# Patient Record
Sex: Male | Born: 1935 | Race: White | Hispanic: No | Marital: Married | Smoking: Former smoker
Health system: Southern US, Community
[De-identification: ages and names within clinical notes are randomized; demographics above are authoritative.]

## PROBLEM LIST (undated history)

## (undated) DIAGNOSIS — E785 Hyperlipidemia, unspecified: Secondary | ICD-10-CM

## (undated) DIAGNOSIS — F0280 Dementia in other diseases classified elsewhere without behavioral disturbance: Secondary | ICD-10-CM

## (undated) DIAGNOSIS — G309 Alzheimer's disease, unspecified: Secondary | ICD-10-CM

## (undated) DIAGNOSIS — N400 Enlarged prostate without lower urinary tract symptoms: Secondary | ICD-10-CM

## (undated) DIAGNOSIS — I4891 Unspecified atrial fibrillation: Secondary | ICD-10-CM

## (undated) HISTORY — PX: PACEMAKER IMPLANT: EP1218

---

## 2017-10-23 ENCOUNTER — Encounter (HOSPITAL_COMMUNITY): Payer: Self-pay

## 2017-10-23 ENCOUNTER — Emergency Department (HOSPITAL_COMMUNITY): Payer: Medicare HMO

## 2017-10-23 ENCOUNTER — Emergency Department (HOSPITAL_COMMUNITY)
Admission: EM | Admit: 2017-10-23 | Discharge: 2017-10-24 | Disposition: A | Discharge status: Other Acute Inpatient Hospital | Payer: Medicare HMO | Attending: Emergency Medicine | Admitting: Emergency Medicine

## 2017-10-23 DIAGNOSIS — G301 Alzheimer's disease with late onset: Secondary | ICD-10-CM | POA: Insufficient documentation

## 2017-10-23 DIAGNOSIS — G3184 Mild cognitive impairment, so stated: Secondary | ICD-10-CM | POA: Insufficient documentation

## 2017-10-23 DIAGNOSIS — F02818 Dementia in other diseases classified elsewhere, unspecified severity, with other behavioral disturbance: Secondary | ICD-10-CM

## 2017-10-23 DIAGNOSIS — Z79899 Other long term (current) drug therapy: Secondary | ICD-10-CM | POA: Insufficient documentation

## 2017-10-23 DIAGNOSIS — R451 Restlessness and agitation: Secondary | ICD-10-CM | POA: Insufficient documentation

## 2017-10-23 DIAGNOSIS — R443 Hallucinations, unspecified: Secondary | ICD-10-CM | POA: Diagnosis not present

## 2017-10-23 DIAGNOSIS — F0281 Dementia in other diseases classified elsewhere with behavioral disturbance: Secondary | ICD-10-CM

## 2017-10-23 DIAGNOSIS — Z008 Encounter for other general examination: Secondary | ICD-10-CM

## 2017-10-23 HISTORY — DX: Hyperlipidemia, unspecified: E78.5

## 2017-10-23 HISTORY — DX: Unspecified atrial fibrillation: I48.91

## 2017-10-23 HISTORY — DX: Alzheimer's disease, unspecified: G30.9

## 2017-10-23 HISTORY — DX: Dementia in other diseases classified elsewhere, unspecified severity, without behavioral disturbance, psychotic disturbance, mood disturbance, and anxiety: F02.80

## 2017-10-23 HISTORY — DX: Benign prostatic hyperplasia without lower urinary tract symptoms: N40.0

## 2017-10-23 LAB — URINALYSIS, ROUTINE W REFLEX MICROSCOPIC
BACTERIA UA: NONE SEEN
GLUCOSE, UA: NEGATIVE mg/dL
KETONES UR: 5 mg/dL — AB
NITRITE: NEGATIVE
PROTEIN: 100 mg/dL — AB
Specific Gravity, Urine: 1.031 — ABNORMAL HIGH (ref 1.005–1.030)
pH: 5 (ref 5.0–8.0)

## 2017-10-23 LAB — CBC WITH DIFFERENTIAL/PLATELET
BASOS PCT: 0 %
Basophils Absolute: 0 10*3/uL (ref 0.0–0.1)
EOS ABS: 0 10*3/uL (ref 0.0–0.7)
Eosinophils Relative: 1 %
HCT: 36.1 % — ABNORMAL LOW (ref 39.0–52.0)
Hemoglobin: 11.5 g/dL — ABNORMAL LOW (ref 13.0–17.0)
Lymphocytes Relative: 24 %
Lymphs Abs: 1.2 10*3/uL (ref 0.7–4.0)
MCH: 30 pg (ref 26.0–34.0)
MCHC: 31.9 g/dL (ref 30.0–36.0)
MCV: 94.3 fL (ref 78.0–100.0)
MONO ABS: 0.5 10*3/uL (ref 0.1–1.0)
MONOS PCT: 11 %
NEUTROS PCT: 64 %
Neutro Abs: 3.3 10*3/uL (ref 1.7–7.7)
PLATELETS: 194 10*3/uL (ref 150–400)
RBC: 3.83 MIL/uL — ABNORMAL LOW (ref 4.22–5.81)
RDW: 14.9 % (ref 11.5–15.5)
WBC: 5 10*3/uL (ref 4.0–10.5)

## 2017-10-23 LAB — COMPREHENSIVE METABOLIC PANEL
ALK PHOS: 66 U/L (ref 38–126)
ALT: 23 U/L (ref 17–63)
AST: 34 U/L (ref 15–41)
Albumin: 3.5 g/dL (ref 3.5–5.0)
Anion gap: 11 (ref 5–15)
BUN: 32 mg/dL — ABNORMAL HIGH (ref 6–20)
CALCIUM: 9.3 mg/dL (ref 8.9–10.3)
CO2: 26 mmol/L (ref 22–32)
CREATININE: 1.48 mg/dL — AB (ref 0.61–1.24)
Chloride: 105 mmol/L (ref 101–111)
GFR calc non Af Amer: 42 mL/min — ABNORMAL LOW (ref >60)
GFR, EST AFRICAN AMERICAN: 48 mL/min — AB (ref >60)
GLUCOSE: 124 mg/dL — AB (ref 65–99)
Potassium: 4 mmol/L (ref 3.5–5.1)
SODIUM: 142 mmol/L (ref 135–145)
TOTAL PROTEIN: 6.6 g/dL (ref 6.5–8.1)
Total Bilirubin: 0.8 mg/dL (ref 0.3–1.2)

## 2017-10-23 LAB — RAPID URINE DRUG SCREEN, HOSP PERFORMED
Amphetamines: NOT DETECTED
Barbiturates: NOT DETECTED
Benzodiazepines: POSITIVE — AB
Cocaine: NOT DETECTED
OPIATES: NOT DETECTED
Tetrahydrocannabinol: NOT DETECTED

## 2017-10-23 LAB — ETHANOL

## 2017-10-23 MED ORDER — ZIPRASIDONE MESYLATE 20 MG IM SOLR
10.0000 mg | Freq: Once | INTRAMUSCULAR | Status: AC
  Administered 2017-10-23: 10 mg via INTRAMUSCULAR
  Filled 2017-10-23: qty 20

## 2017-10-23 MED ORDER — ZIPRASIDONE MESYLATE 20 MG IM SOLR: 10.0000 mg | Freq: Two times a day (BID) | INTRAMUSCULAR | Status: DC | PRN

## 2017-10-23 MED ORDER — LORAZEPAM 1 MG PO TABS: 1.0000 mg | ORAL_TABLET | Freq: Four times a day (QID) | ORAL | Status: DC | PRN

## 2017-10-23 MED ORDER — STERILE WATER FOR INJECTION IJ SOLN
INTRAMUSCULAR | Status: AC
  Administered 2017-10-23: 10 mL
  Filled 2017-10-23: qty 10

## 2017-10-23 MED ORDER — ZOLPIDEM TARTRATE 5 MG PO TABS
5.0000 mg | ORAL_TABLET | Freq: Every evening | ORAL | Status: DC | PRN
  Administered 2017-10-23: 5 mg via ORAL
  Filled 2017-10-23: qty 1

## 2017-10-23 MED ORDER — LORAZEPAM 2 MG/ML IJ SOLN
1.0000 mg | Freq: Once | INTRAMUSCULAR | Status: AC
  Administered 2017-10-23: 1 mg via INTRAMUSCULAR
  Filled 2017-10-23: qty 1

## 2017-10-23 MED ORDER — OLANZAPINE 5 MG PO TBDP
5.0000 mg | ORAL_TABLET | Freq: Three times a day (TID) | ORAL | Status: DC | PRN
  Administered 2017-10-23: 5 mg via ORAL
  Filled 2017-10-23: qty 1

## 2017-10-23 NOTE — ED Provider Notes (Signed)
[facility] EMERGENCY DEPARTMENT Provider Note   CSN: [id number] Arrival date & time: 10/23/17  1556     History   Chief Complaint Chief Complaint  Patient presents with  . Agitation  . Hallucinations    HPI Aaron Preston is a 84 y.o. male.  Patient presents by EMS for evaluation of agitated behavior.  Currently resides in a nursing care facility, who reported to EMS that they could not handle him there with the behavior he is exhibiting.  Documentation with the patient indicate that he has been "yelling, screaming, hallucinating, day and night.  Staff members at the facility indicated to EMS that they wanted him transferred to a geriatric psychiatric hospital.  Patient is unable to contribute to history.  Level 5 caveat-dementia  HPI  Past Medical History:  Diagnosis Date  . A-fib (HCC)   . Alzheimer's dementia   . BPH (benign prostatic hyperplasia)   . Hyperlipidemia     There are no active problems to display for this patient.   Past Surgical History:  Procedure Laterality Date  . PACEMAKER IMPLANT         Home Medications    Prior to Admission medications   Medication Sig Start Date End Date Taking? Authorizing Provider  aspirin 81 MG chewable tablet Chew by mouth daily.   Yes [provider]  atorvastatin (LIPITOR) 40 MG tablet Take 40 mg by mouth daily.   Yes [provider]  bisacodyl (DULCOLAX) 5 MG EC tablet Take 5 mg by mouth once.   Yes [provider]  divalproex (DEPAKOTE SPRINKLE) 125 MG capsule Take 250 mg by mouth 3 (three) times daily. At 0800, 1200, 1700-do not crush   Yes [provider]  donepezil (ARICEPT) 10 MG tablet Take 10 mg by mouth at bedtime.   Yes [provider]  FLUoxetine (PROZAC) 10 MG capsule Take 10 mg by mouth 2 (two) times daily.   Yes [provider]  Lysine 1000 MG TABS Take 0.5 tablets by mouth 1 day or 1 dose.   Yes [provider]  memantine (NAMENDA) 10 MG  tablet Take 10 mg by mouth 2 (two) times daily.   Yes [provider]  Misc Natural Products (OSTEO BI-FLEX/5-LOXIN ADVANCED PO) Take 1 tablet by mouth 1 day or 1 dose.   Yes [provider]  Multiple Vitamins-Minerals (ICAPS) TABS Take 1 tablet by mouth 1 day or 1 dose.   Yes [provider]  Omega-3 Fatty Acids (FISH OIL) 1000 MG CAPS Take 2,000 mg by mouth 1 day or 1 dose.   Yes [provider]  tamsulosin (FLOMAX) 0.4 MG CAPS capsule Take 0.4 mg by mouth daily.   Yes [provider]  traZODone (DESYREL) 50 MG tablet Take 50 mg by mouth at bedtime.   Yes [provider]  vitamin B-12 (CYANOCOBALAMIN) 1000 MCG tablet Take 1,000 mcg by mouth daily.   Yes [provider]  senna-docusate (SENOKOT-S) 8.6-50 MG tablet Take 2 tablets by mouth 2 (two) times daily. PRN for constipation    [provider]    Family History No family history on file.  Social History Social History   Tobacco Use  . Smoking status: Former Smoker    Types: Pipe  Substance Use Topics  . Alcohol use: Yes    Comment: social  . Drug use: No     Allergies   Patient has no known allergies.   Review of Systems Review of Systems  Unable to perform ROS: Dementia     Physical Exam Updated Vital Signs BP (!) 169/112   Pulse 79   Temp 97.8 F (36.6 C) (Axillary)   Resp 17   SpO2 100%   Physical Exam  Constitutional: He appears well-developed. He appears distressed (Agitated, requiring restraints to stay in bed).  Elderly, cursing, spitting, yelling, and not redirectable.  A police officer is holding his arms to prevent him from harming himself or staff.  HENT:  Head: Normocephalic and atraumatic.  Right Ear: External ear normal.  Left Ear: External ear normal.  Eyes: Conjunctivae and EOM are normal. Pupils are equal, round, and reactive to light.  Neck: Normal range of motion and phonation normal. Neck supple.  Cardiovascular:  Normal rate, regular rhythm and normal heart sounds.  Pulmonary/Chest: Effort normal and breath sounds normal. He exhibits no bony tenderness.  Musculoskeletal: Normal range of motion.  Neurological: He is alert. No cranial nerve deficit. He exhibits normal muscle tone. Coordination normal.  No dysarthria.  Uncooperative with suggestions to calm down and answer questions.  Skin: Skin is warm, dry and intact.  Psychiatric:  Agitated  Nursing note and vitals reviewed.    ED Treatments / Results  Labs (all labs ordered are listed, but only abnormal results are displayed) Labs Reviewed  COMPREHENSIVE METABOLIC PANEL - Abnormal; Notable for the following components:      Result Value   Glucose, Bld 124 (*)    BUN 32 (*)    Creatinine, Ser 1.48 (*)    GFR calc non Af Amer 42 (*)    GFR calc Af Amer 48 (*)    All other components within normal limits  CBC WITH DIFFERENTIAL/PLATELET - Abnormal; Notable for the following components:   RBC 3.83 (*)    Hemoglobin 11.5 (*)    HCT 36.1 (*)    All other components within normal limits  RAPID URINE DRUG SCREEN, HOSP PERFORMED - Abnormal; Notable for the following components:   Benzodiazepines POSITIVE (*)    All other components within normal limits  URINALYSIS, ROUTINE W REFLEX MICROSCOPIC - Abnormal; Notable for the following components:   Color, Urine AMBER (*)    APPearance HAZY (*)    Specific Gravity, Urine 1.031 (*)    Hgb urine dipstick MODERATE (*)    Bilirubin Urine SMALL (*)    Ketones, ur 5 (*)    Protein, ur 100 (*)    Leukocytes, UA TRACE (*)    Squamous Epithelial / LPF 0-5 (*)    All other components within normal limits  ETHANOL    EKG  EKG Interpretation None       Radiology Ct Head Wo Contrast  Result Date: 10/23/2017 CLINICAL DATA:  Altered mental status EXAM: CT HEAD WITHOUT CONTRAST TECHNIQUE: Contiguous axial images were obtained from the base of the skull through the vertex without intravenous contrast.  COMPARISON:  None. FINDINGS: Brain: No mass lesion, intraparenchymal hemorrhage or extra-axial collection. No evidence of acute cortical infarct. There is periventricular hypoattenuation compatible with chronic microvascular disease. 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 Generalized volume loss with ex vacuo dilatation of the ventricles. Vascular: No hyperdense vessel or unexpected calcification. Skull: Normal visualized skull base, calvarium and extracranial soft tissues. Sinuses/Orbits: No sinus fluid levels or advanced mucosal thickening. No mastoid effusion. Normal orbits. 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 Electronically Signed   By: Chrisandra NettersKevin  Herman M.D.  On: 10/23/2017 19:37    Procedures Procedures (including critical care time)  Medications Ordered in ED Medications  zolpidem (AMBIEN) tablet 5 mg (5 mg Oral Given 10/23/17 2236)  OLANZapine zydis (ZYPREXA) disintegrating tablet 5 mg (5 mg Oral Given 10/23/17 2236)    And  LORazepam (ATIVAN) tablet 1 mg (not administered)    And  ziprasidone (GEODON) injection 10 mg (not administered)  ziprasidone (GEODON) injection 10 mg (10 mg Intramuscular Given 10/23/17 1625)  LORazepam (ATIVAN) injection 1 mg (1 mg Intramuscular Given 10/23/17 1954)  sterile water (preservative free) injection (10 mLs  Given 10/23/17 1626)     Initial Impression / Assessment and Plan / ED Course  I have reviewed the triage vital signs and the nursing notes.  Pertinent labs & imaging results that were available during my care of the patient were reviewed by me and considered in my medical decision making (see chart for details).  Clinical Course as of Oct 24 42  Thu Oct 23, 2017  1806 Abnormal elevation, catheter sample.  Doubt urinary tract infection RBC / HPF: TOO NUMEROUS TO COUNT [EW]  1807 Mild elevation Creatinine: (!) 1.48 [EW]  1807 Normal Sodium:  142 [EW]  1807 normal WBC: 5.0 [EW]  1807 normal Alcohol, Ethyl (B): <10 [EW]    Clinical Course User Index [EW] Mancel Bale, MD     Patient Vitals for the past 24 hrs:  BP Temp Temp src Pulse Resp SpO2  10/24/17 0030 (!) 169/112 - - - - -  10/23/17 2230 (!) 142/96 - - 79 - 100 %  10/23/17 2200 120/73 - - 69 - 97 %  10/23/17 2130 (!) 147/73 - - 76 - 99 %  10/23/17 2100 129/81 - - 65 - 96 %  10/23/17 2000 (!) 114/55 - - 66 - 95 %  10/23/17 1957 (!) 129/57 97.8 F (36.6 C) Axillary 69 17 95 %  10/23/17 1816 (!) 123/59 - - 70 16 96 %  10/23/17 1815 (!) 123/59 - - - - -  10/23/17 1609 109/64 - - - - -  10/23/17 1604 - (!) 97 F (36.1 C) - 70 20 98 %    After screening labs and physical examination, the patient is medically cleared for treatment by psychiatry.   Final Clinical Impressions(s) / ED Diagnoses   Final diagnoses:  Agitation  Late onset Alzheimer's disease with behavioral disturbance   Confusion, with behavioral disturbance and Alzheimer's dementia.  Doubt serious bacterial infection, urinary tract infection, CVA, metabolic instability.  He requires evaluation and treatment by psychiatry.  He will likely need to be admitted to a geriatric psychiatric facility.  Nursing Notes Reviewed/ Care Coordinated Applicable Imaging Reviewed Interpretation of Laboratory Data incorporated into ED treatment   Plan-as per TTS in conjunction with oncoming provider team  ED Discharge Orders    None       Mancel Bale, MD 10/24/17 [phone]

## 2017-10-23 NOTE — ED Notes (Signed)
Family at bedside at this time

## 2017-10-23 NOTE — ED Notes (Signed)
tts in progress at this time 

## 2017-10-23 NOTE — BH Assessment (Signed)
[facility] Assessment Progress Note  Per Nira ConnJason Berry, NP pt is recommended for gero-psych treatment. TTS to seek placement. EDP Dr. Effie ShyWentz, MD has been advised of the disposition.  Princess BruinsAquicha Lawerence Dery, MSW, LCSW Therapeutic Triage Specialist  [phone]

## 2017-10-23 NOTE — ED Triage Notes (Addendum)
[facility] due to aggressive and combative behavior. Behavior has been present for 2 weeks or more  and has increased. Pt has alzheimers and seen by psychiatrist. Pt has not been sleeping at night due to agitation

## 2017-10-23 NOTE — Progress Notes (Signed)
TTS attempted to notify the pt's nurse of the disposition but did not get an answer after multiple attempts.  Princess BruinsAquicha Ketan Renz, MSW, LCSW Therapeutic Triage Specialist  [phone]

## 2017-10-23 NOTE — ED Notes (Signed)
Pt yelling out at this time, unable to make out what pt is saying, sitter reports pt has hit side rails on bed a few times

## 2017-10-23 NOTE — ED Notes (Signed)
Patient transported to CT 

## 2017-10-23 NOTE — ED Notes (Signed)
Pt brought in by EMS. Pt agitated and screaming at EMS and staff. Unable to make sense of what pt is saying, Pt attempting to spit on staff. Wife and family at bedside. Family reports he has been at assisted living for 2 weeks and have declined mentally and physically for the past weeks. Staff unable to handle pt per family

## 2017-10-23 NOTE — BH Assessment (Addendum)
Tele Assessment Note   Patient Name: Aaron Preston MRN: [id number] Referring Physician: Mancel Bale, MD Location of Patient: [facility] Location of Provider: Behavioral Health TTS Department  Mort Smelser is an 84 y.o. male who presents to the ED voluntarily accompanied by his wife and his caregiver. Pt does not respond to direct questions during the assessment and much of the information is gathered from collateral sources. Pt appears to be restless in the bed and constantly fidgeting with his clothing and the blanket. Pt's caregiver reports the pt was recently relocated to an assisted living facility due to being too aggressive to manage at home. The caregiver states since the pt has gone to the assisted living facility 2 weeks ago, he has declined significantly. Pt's caregiver reports the pt was walking on his own prior to going to the facility but he is now in a wheelchair. The pt reportedly refuses to eat, does not groom, punches holes in the walls, kicks at individuals that try to assist him, does not speak, and urinates and defecates on himself. Pt's caregiver states this all just started after he moved into the assisted living facility 2 weeks ago. Caregiver states the pt has been experiencing AVH. Caregiver states the AVH began about 2 months ago. The pt reportedly has delusions that his wife is a man named "Joe" and has conversations with "imaginary people."   Caregiver states the pt was admitted to [facility] in 2018 due to similar concerns and states the pt's condition rapidly improved after the inpt admission. Pt has a current OPT provider Eustace Moore MD to assist with medication management. Pt's caregiver states the pt was prescribed Trazodone to assist with insomnia but states it has not helped at all.   Per Nira Conn, NP pt is recommended for gero-psych treatment. TTS to seek placement. EDP Dr. Effie Shy, MD has been advised of the disposition.  Diagnosis: Mild neurocognitive  disorder due to another medical condition   Past Medical History:  Past Medical History:  Diagnosis Date  . A-fib (HCC)   . Alzheimer's dementia   . BPH (benign prostatic hyperplasia)   . Hyperlipidemia     Past Surgical History:  Procedure Laterality Date  . PACEMAKER IMPLANT      Family History: No family history on file.  Social History:  reports that he has quit smoking. His smoking use included pipe. He does not have any smokeless tobacco history on file. He reports that he drinks alcohol. He reports that he does not use drugs.  Additional Social History:  Alcohol / Drug Use Pain Medications: See MAR Prescriptions: See MAR Over the Counter: See MAR History of alcohol / drug use?: No history of alcohol / drug abuse  CIWA: CIWA-Ar BP: 129/81 Pulse Rate: 65 COWS:    Allergies: No Known Allergies  Home Medications:  (Not in a hospital admission)  OB/GYN Status:  No LMP for male patient.  General Assessment Data Location of Assessment: [facility] TTS Assessment: In system Is this a Tele or Face-to-Face Assessment?: Tele Assessment Is this an Initial Assessment or a Re-assessment for this encounter?: Initial Assessment Marital status: Married Is patient pregnant?: No Pregnancy Status: No Living Arrangements: Other (Comment)([place]) Can pt return to current living arrangement?: No Admission Status: Voluntary Is patient capable of signing voluntary admission?: Yes Referral Source: Self/Family/Friend Insurance type: [facility]     Crisis Care Plan Living Arrangements: Other (Comment)([place]) Name of Psychiatrist: Eustace Moore  MD Name of Therapist: none  Education Status Is patient currently in school?: No Highest grade of school patient has completed: 12th  Risk to self with the past 6 months Suicidal Ideation: No Has patient been a risk to self within the past 6 months prior to admission? : No Suicidal Intent:  No Has patient had any suicidal intent within the past 6 months prior to admission? : No Is patient at risk for suicide?: No Suicidal Plan?: No Has patient had any suicidal plan within the past 6 months prior to admission? : No Access to Means: No What has been your use of drugs/alcohol within the last 12 months?: denies Previous Attempts/Gestures: No Triggers for Past Attempts: None known Intentional Self Injurious Behavior: None Family Suicide History: Yes(pt's father commited suicide ) Recent stressful life event(s): Recent negative physical changes Persecutory voices/beliefs?: No Depression: No Depression Symptoms: Insomnia Substance abuse history and/or treatment for substance abuse?: No Suicide prevention information given to non-admitted patients: Not applicable  Risk to Others within the past 6 months Homicidal Ideation: No Does patient have any lifetime risk of violence toward others beyond the six months prior to admission? : Yes (comment)(per caregiver, pt has kicked at others ) Thoughts of Harm to Others: No Current Homicidal Intent: No Current Homicidal Plan: No Access to Homicidal Means: No History of harm to others?: Yes Assessment of Violence: On admission Violent Behavior Description: per caregiver, pt has kicked at others and will ball his fist up as if he is going to hit someone  Does patient have access to weapons?: No Criminal Charges Pending?: No Does patient have a court date: No Is patient on probation?: No  Psychosis Hallucinations: Auditory, Visual(per caregiver ) Delusions: Unspecified  Mental Status Report Appearance/Hygiene: Disheveled Eye Contact: Poor Motor Activity: Unsteady, Restlessness Speech: Incoherent Level of Consciousness: Restless Mood: Labile Affect: Labile Anxiety Level: None Thought Processes: Thought Blocking Judgement: Impaired Orientation: Not oriented Obsessive Compulsive Thoughts/Behaviors: Severe  Cognitive  Functioning Concentration: Poor Memory: Remote Impaired, Recent Impaired IQ: Average Insight: Poor Impulse Control: Poor Appetite: Fair Sleep: Decreased Total Hours of Sleep: 4 Vegetative Symptoms: Decreased grooming, Not bathing  ADLScreening [facility] Assessment Services) Patient's cognitive ability adequate to safely complete daily activities?: No Patient able to express need for assistance with ADLs?: No Independently performs ADLs?: No  Prior Inpatient Therapy Prior Inpatient Therapy: Yes Prior Therapy Dates: 2018 Prior Therapy Facilty/Provider(s): [place] Reason for Treatment: Aggression  Prior Outpatient Therapy Prior Outpatient Therapy: Yes Prior Therapy Dates: current Prior Therapy Facilty/Provider(s): Eustace Moore MD Reason for Treatment: med management  Does patient have an ACCT team?: No Does patient have Intensive In-House Services?  : No Does patient have [facility] services? : No Does patient have P4CC services?: No  ADL Screening (condition at time of admission) Patient's cognitive ability adequate to safely complete daily activities?: No Is the patient deaf or have difficulty hearing?: Yes Does the patient have difficulty seeing, even when wearing glasses/contacts?: Yes Does the patient have difficulty concentrating, remembering, or making decisions?: Yes Patient able to express need for assistance with ADLs?: No Does the patient have difficulty dressing or bathing?: Yes Independently performs ADLs?: No Communication: Needs assistance Is this a change from baseline?: Pre-admission baseline Dressing (OT): Needs assistance Is this a change from baseline?: Pre-admission baseline Grooming: Needs assistance Is this a change from baseline?: Pre-admission baseline Feeding: Independent Bathing: Needs assistance Is this a change from baseline?: Pre-admission baseline Toileting: Needs assistance Is this a change from baseline?:  Pre-admission  baseline In/Out Bed: Needs assistance Is this a change from baseline?: Pre-admission baseline Walks in Home: Needs assistance Is this a change from baseline?: Pre-admission baseline Does the patient have difficulty walking or climbing stairs?: Yes Weakness of Legs: Both Weakness of Arms/Hands: None  Home Assistive Devices/Equipment Home Assistive Devices/Equipment: Wheelchair    Abuse/Neglect Assessment (Assessment to be complete while patient is alone) Abuse/Neglect Assessment Can Be Completed: Yes Physical Abuse: Denies Verbal Abuse: Denies Sexual Abuse: Denies Exploitation of patient/patient's resources: Denies Self-Neglect: Denies     Merchant navy officerAdvance Directives (For Healthcare) Does Patient Have a Medical Advance Directive?: Yes Type of Advance Directive: Healthcare Power of Attorney    Additional Information 1:1 In Past 12 Months?: No CIRT Risk: Yes Elopement Risk: No Does patient have medical clearance?: Yes     Disposition: Per Nira ConnJason Berry, NP pt is recommended for gero-psych treatment. TTS to seek placement. EDP Dr. Effie ShyWentz, MD has been advised of the disposition.  Disposition Initial Assessment Completed for this Encounter: Yes Disposition of Patient: Inpatient treatment program Type of inpatient treatment program: Adult(per Nira ConnJason Berry, NP)  This service was provided via telemedicine using a 2-way, interactive audio and video technology.  Names of all persons participating in this telemedicine service and their role in this encounter. Name: Aaron Preston Role: Patient  Name: Michelle PiperMargie Merriweather Role: Wife  Name: Darl PikesSusan Role: Caregiver  Name: Princess Bruinsquicha Kinzley Savell Role: TTS Counselor    Karolee Ohsquicha R Tyran Huser 10/23/2017 9:09 PM

## 2017-10-23 NOTE — ED Notes (Signed)
Family requests call when pt placement found [phone] or [phone]

## 2017-10-24 ENCOUNTER — Emergency Department (HOSPITAL_COMMUNITY): Payer: Medicare HMO

## 2017-10-24 NOTE — ED Notes (Signed)
Call to Vergia Albertsouglas Barfoot, son, [place]CPOA for voluntary admission and permission to transfer   [phone]  Permission granted and POA apprised that he will be called when pt is leaving [facility]

## 2017-10-24 NOTE — ED Notes (Signed)
Report to Maureen RalphsVivian, RN  [facility]  Please call when enroute

## 2017-10-24 NOTE — ED Provider Notes (Signed)
Patient accepted [facility]. On exam patient is sleeping, appears comfortable, he is hemodynamically unremarkable.   Gerhard MunchLockwood, Marella Vanderpol, MD 10/24/17 [phone]

## 2017-10-24 NOTE — ED Notes (Signed)
Pt given lunch tray.

## 2017-10-24 NOTE — Progress Notes (Addendum)
CSW received a call from Artis FlockDoug Polgar, pt's son, [phone]) requesting hospitalization [facility].  CSW related that ref[facility] had been [facility] to [facility].  CSW called and left a message for Thana FarrMelissa, AC, at [facility] re: son's request. CSW will continue to follow for placement.  Timmothy EulerJean T. Kaylyn LimSutter, MSW, LCSWA Disposition Clinical Social Work [phone] (cell) [phone] (office)

## 2017-10-24 NOTE — Progress Notes (Signed)
Melissa from [facility] called and asked that patient have an EKG and a chest x-ray before finalizing acceptance.  CSW requested from pt's Nurse, Neysa Bonitohristy, RN.  Timmothy EulerJean T. Kaylyn LimSutter, MSW, LCSWA Disposition Clinical Social Work [phone] (cell) [phone] (office)

## 2017-10-24 NOTE — ED Notes (Signed)
Pt shouting, loud and disruptive  [facility]

## 2017-10-24 NOTE — BH Assessment (Signed)
[facility] Assessment Progress Note  Clinician attempted reassessment today. Per chart, no aggressive bx noted during the night. Darl PikesSusan, RN, went into pt's room to see if he could be assessed. She reported that he wouldn't open his eyes and was waving his arms around and making garbled, unintelligible speech. Pt was assessed by TTS 12 hours ago and it does not seem beneficial nor necessary, at this time, to attempt a F2F reassessment, given pt's mental state. Pt has been referred to several facilities by LCSW this morning. If he is not accepted for admission somewhere today, he will be given a F2F reassessment tomorrow.   Aaron ShockSamantha M. Ladona Ridgelaylor, MS, NCC, LPCA Counselor

## 2017-10-24 NOTE — ED Notes (Signed)
Call to son and [facility] that pt is leaving now

## 2017-10-24 NOTE — Progress Notes (Signed)
CSW sent EKG, Chest x-ray, CT and HCPOA paperwork [facility].  CSW called to notify and will wait for final decision on acceptance.  Timmothy EulerJean T. Kaylyn LimSutter, MSW, LCSWA Disposition Clinical Social Work [phone] (cell) [phone] (office)

## 2017-10-24 NOTE — Progress Notes (Signed)
Patient chart reviewed.  Patient meets inpatient criteria per Nira ConnJason Berry, NP. Patient requires gero-psych inpatient treatment [facility]. Patient referrals sent to the following [facility]iatric psychiatric hospitals:  [facility]     [facility]  [facility]        Disposition CSW will continue to follow for placement.  Timmothy EulerJean T. Kaylyn LimSutter, MSW, LCSWA Disposition Clinical Social Work [phone] (cell) [phone] (office)

## 2017-10-24 NOTE — Progress Notes (Signed)
[facility]  Dr. Lowanda FosterBeverly Jones is the accepting provider.  Call report to [phone]  Prescott ParmaAnne Tuttle, [email or web address] ED notified.   Pt is Voluntary.  Pt may be transported by [facility]  Pt scheduled  [facility] as transport can be arranged.  Timmothy EulerJean T. Kaylyn LimSutter, MSW, LCSWA Disposition Clinical Social Work [phone] (cell) [phone] (office)

## 2018-01-14 DEATH — deceased

## 2018-08-12 IMAGING — CT CT HEAD W/O CM
4 of 5 series · 16 of 47 positions shown, 18 images · non-contrast
Comparison: None.

CLINICAL DATA: Altered mental status

EXAM:
CT HEAD WITHOUT CONTRAST
TECHNIQUE: Contiguous axial images were obtained from the base of the skull
through the vertex without intravenous contrast.

[Series 2: head wo · axial · 0.45mm/px · z∈[+9,+134]mm · 8 of 33 slices shown, 10 images (1 of 2)]
[im 4/33  brain]
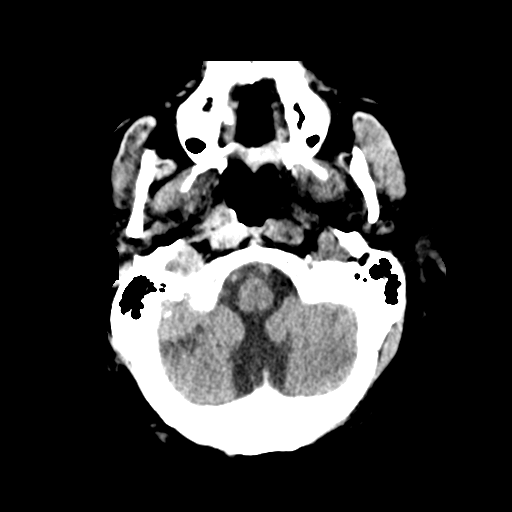
[im 4/33  bone]
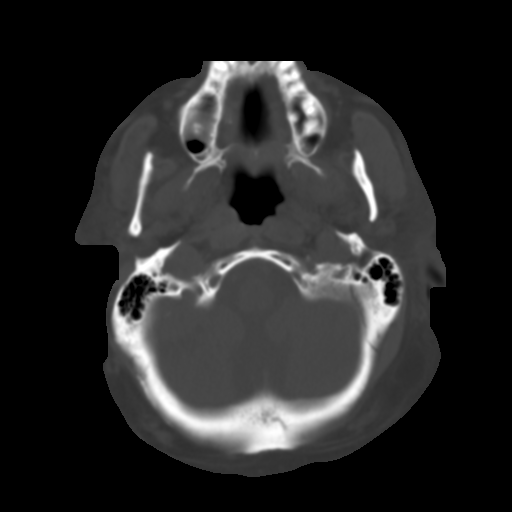
[im 8/33  brain]
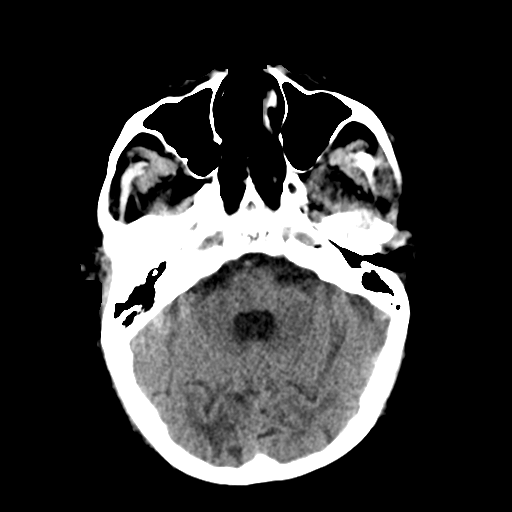
[im 11/33  brain]
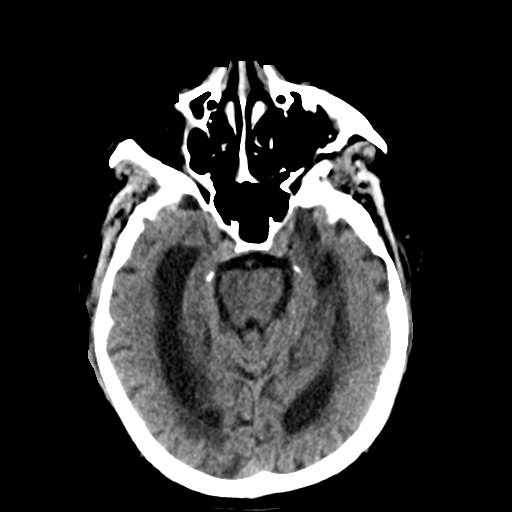
[im 15/33  brain]
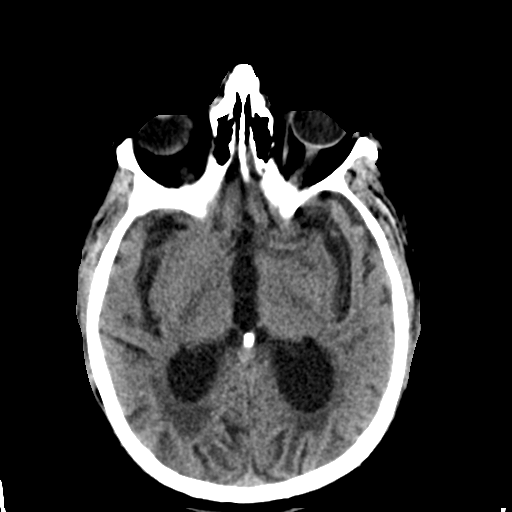
[im 18/33  brain]
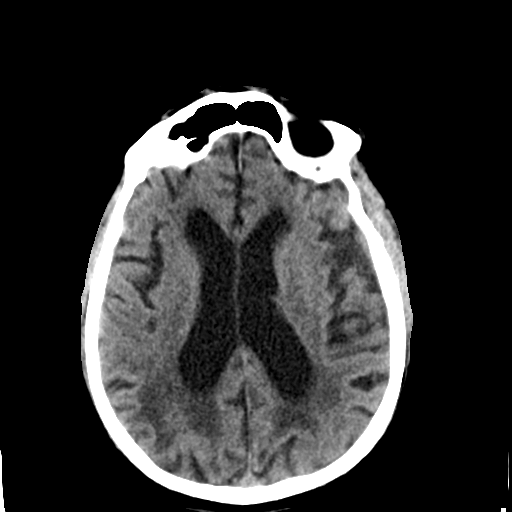
[im 18/33  bone]
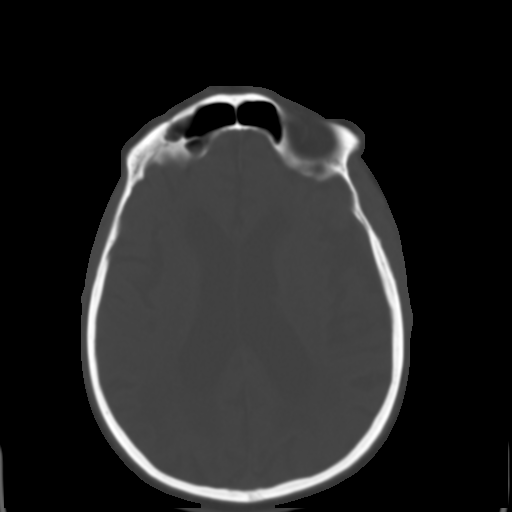
[im 22/33  brain]
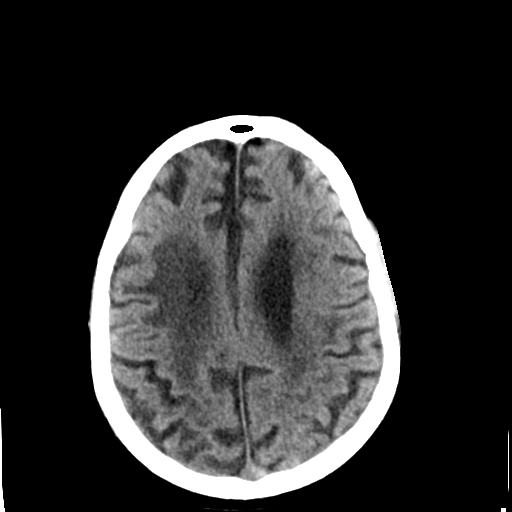
[im 25/33  brain]
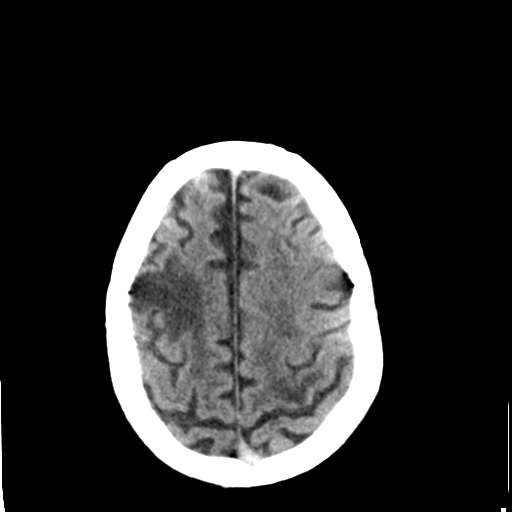
[im 29/33  brain]
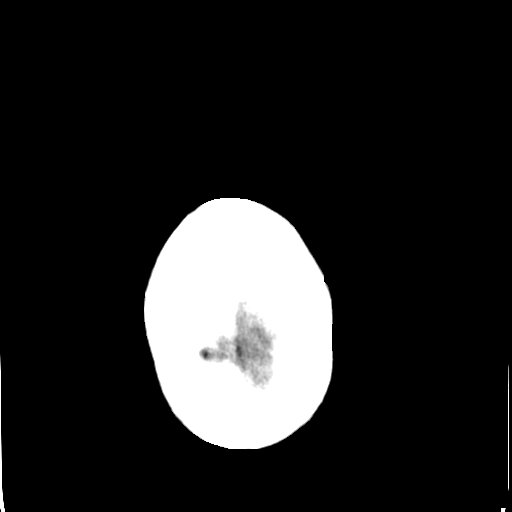

[Series 4: coronal soft tissue · coronal · 0.36mm/px · 3 of 77 slices shown]
[im 26/77  brain]
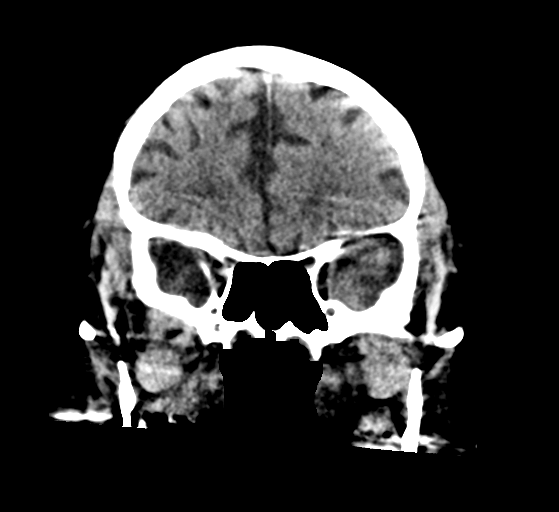
[im 34/77  brain]
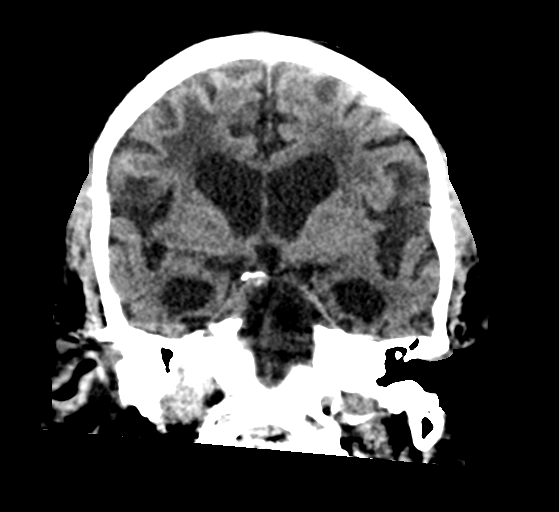
[im 43/77  brain]
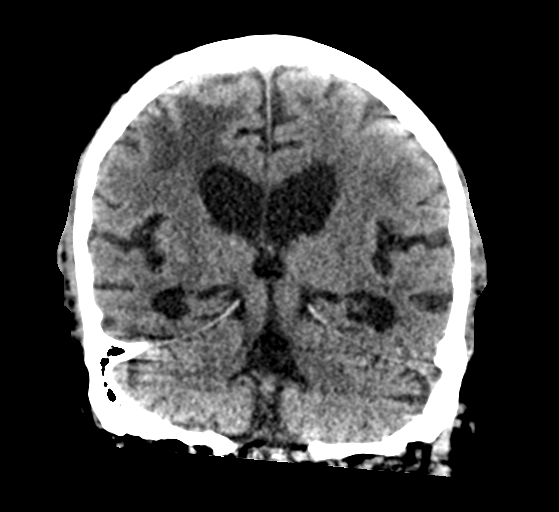

[Series 5: sagittal soft tissue · sagittal · 0.37mm/px · 3 of 67 slices shown]
[im 26/67  brain]
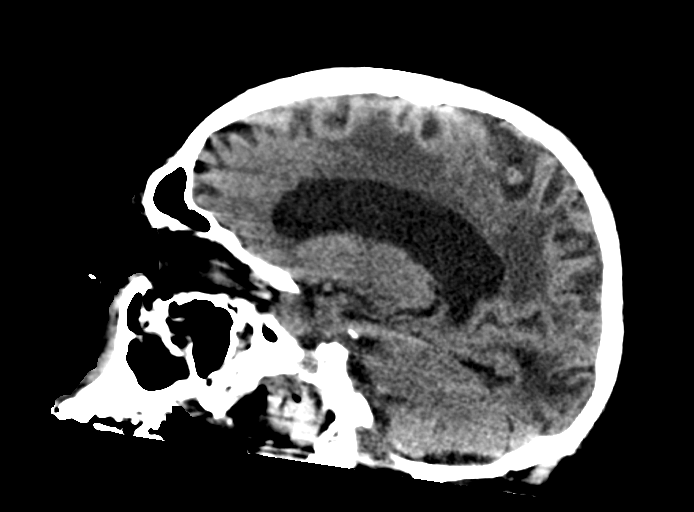
[im 34/67  brain]
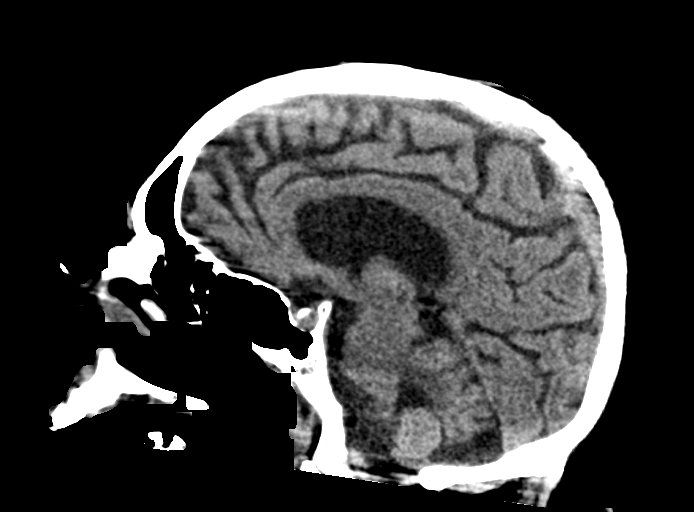
[im 41/67  brain]
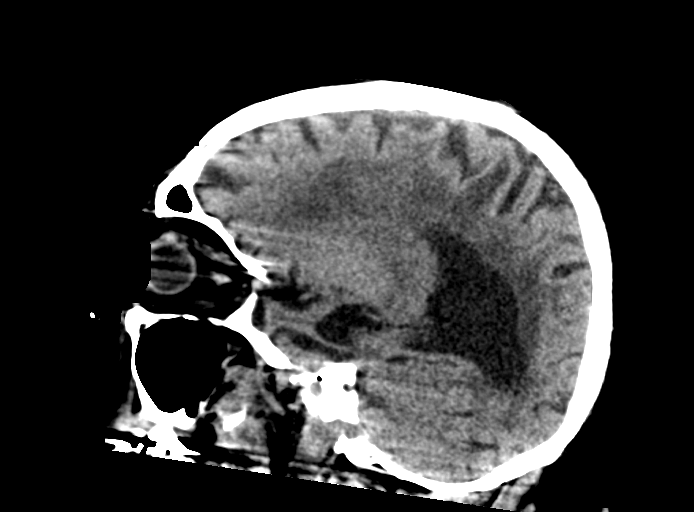

[Series 6: head wo · axial · 0.49mm/px · z∈[+9,+29]mm · 2 of 33 slices shown (2 of 2)]
[im 4/33  brain]
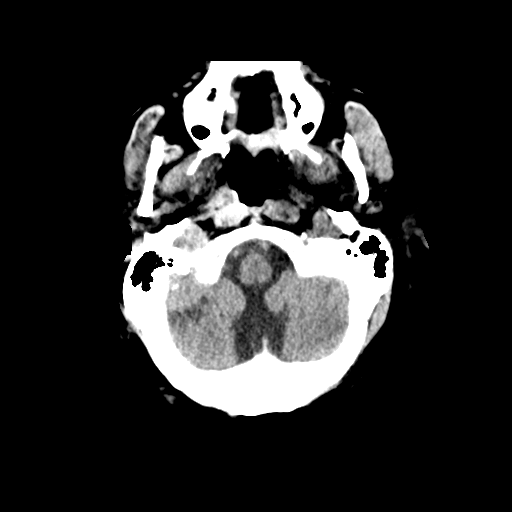
[im 8/33  brain]
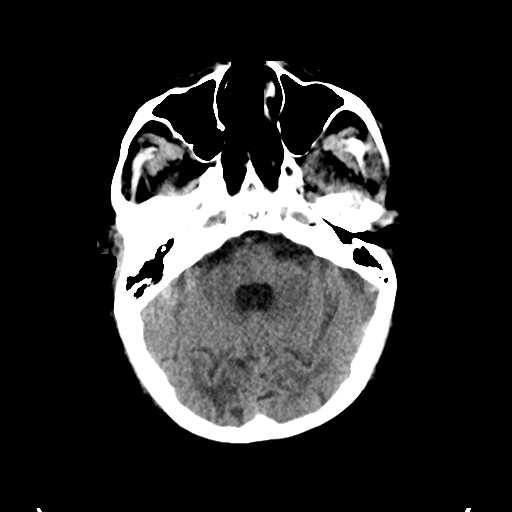

[16 of 47 positions shown; findings below may reference images not displayed]

FINDINGS: Brain: No mass lesion, intraparenchymal hemorrhage or extra-axial
collection. No evidence of acute cortical infarct. There is
periventricular hypoattenuation compatible with chronic
microvascular disease. Old right frontal lobe and right cerebellar
infarct. Generalized volume loss with ex vacuo dilatation of the
ventricles.

Vascular: No hyperdense vessel or unexpected calcification.

Skull: Normal visualized skull base, calvarium and extracranial soft
tissues.

Sinuses/Orbits: No sinus fluid levels or advanced mucosal
thickening. No mastoid effusion. Normal orbits.
IMPRESSION: Chronic ischemic microangiopathy and old right frontal lobe and
cerebellar infarcts without acute intracranial abnormality.
